# Patient Record
Sex: Female | Born: 1970
Health system: Southern US, Community
[De-identification: ages and names within clinical notes are randomized; demographics above are authoritative.]

## PROBLEM LIST (undated history)

## (undated) DIAGNOSIS — I471 Supraventricular tachycardia: Secondary | ICD-10-CM

## (undated) HISTORY — PX: ABLATION: SHX5711

---
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
## 2006-04-10 HISTORY — PX: ABDOMINAL HYSTERECTOMY: SHX81

## 2011-03-29 ENCOUNTER — Emergency Department: Payer: Self-pay | Admitting: Internal Medicine

## 2012-06-24 ENCOUNTER — Encounter (HOSPITAL_COMMUNITY): Payer: Self-pay | Admitting: *Deleted

## 2012-06-24 ENCOUNTER — Emergency Department (HOSPITAL_COMMUNITY)
Admission: EM | Admit: 2012-06-24 | Discharge: 2012-06-24 | Disposition: A | Discharge status: Home / Self Care | Payer: 59 | Source: Home / Self Care

## 2012-06-24 DIAGNOSIS — R05 Cough: Secondary | ICD-10-CM

## 2012-06-24 DIAGNOSIS — R059 Cough, unspecified: Secondary | ICD-10-CM

## 2012-06-24 DIAGNOSIS — J069 Acute upper respiratory infection, unspecified: Secondary | ICD-10-CM

## 2012-06-24 MED ORDER — HYDROCODONE-HOMATROPINE 5-1.5 MG/5ML PO SYRP: 5.0000 mL | ORAL_SOLUTION | Freq: Four times a day (QID) | ORAL | Status: DC | PRN

## 2012-06-24 NOTE — ED Provider Notes (Signed)
History     CSN: [id number]  Arrival date & time 06/24/12  1651   None     Chief Complaint  Patient presents with  . URI    (Consider location/radiation/quality/duration/timing/severity/associated sxs/prior treatment) HPI Comments: 42 year old female states she has had a head cold for 3-4 weeks. Is dominated by a persistent cough. At nighttime she has PND that also exacerbates her cough. She has a change in voice but no dyspnea. She felt hot then cold and had sweating this past weekend but no documented fever. She feels like there is a deep down chest congestion. She denies earache or sore throat.   History reviewed. No pertinent past medical history.  Past Surgical History  Procedure Laterality Date  . Appendectomy    . Abdominal hysterectomy      Family History  Problem Relation Age of Onset  . Colon cancer Mother     History  Substance Use Topics  . Smoking status: Never Smoker   . Smokeless tobacco: Not on file  . Alcohol Use: Yes     Comment: socially    OB History   Grav Para Term Preterm Abortions TAB SAB Ect Mult Living                  Review of Systems  Constitutional: Negative for fever, chills, activity change, appetite change and fatigue.  HENT: Positive for congestion, rhinorrhea and postnasal drip. Negative for sore throat, facial swelling, neck pain and neck stiffness.   Eyes: Negative.   Respiratory: Negative.   Cardiovascular: Negative.   Gastrointestinal: Negative.   Genitourinary: Negative.   Musculoskeletal: Negative.   Skin: Negative for pallor and rash.  Neurological: Negative.     Allergies  Penicillins  Home Medications   Current Outpatient Rx  Name  Route  Sig  Dispense  Refill  . estradiol (VIVELLE-DOT) 0.05 MG/24HR   Transdermal   Place 1 patch onto the skin 2 (two) times a week.         . Pseudoeph-Doxylamine-DM-APAP (NYQUIL MULTI-SYMPTOM PO)   Oral   Take by mouth.         Marland Kitchen HYDROcodone-homatropine (HYCODAN)  5-1.5 MG/5ML syrup   Oral   Take 5 mLs by mouth every 6 (six) hours as needed for cough.   120 mL   0     BP 118/59  Pulse 75  Temp(Src) 98.1 F (36.7 C) (Oral)  Resp 16  SpO2 99%  Physical Exam  Nursing note and vitals reviewed. Constitutional: She is oriented to person, place, and time. She appears well-developed and well-nourished. No distress.  HENT:  Bilateral TMs are normal Oropharynx with minor erythema and clear PND. No exudates or tonsillar enlargement.  Eyes: Conjunctivae and EOM are normal.  Neck: Normal range of motion. Neck supple.  Cardiovascular: Normal rate, regular rhythm and normal heart sounds.   Pulmonary/Chest: Effort normal and breath sounds normal. No respiratory distress. She has no wheezes. She has no rales.  Musculoskeletal: Normal range of motion. She exhibits no edema.  Lymphadenopathy:    She has no cervical adenopathy.  Neurological: She is alert and oriented to person, place, and time.  Skin: Skin is warm and dry. No rash noted.  Psychiatric: She has a normal mood and affect.    ED Course  Procedures (including critical care time)  Labs Reviewed - No data to display No results found.   1. URI (upper respiratory infection)   2. Cough       MDM  The patient did not have a source for bacterial infection. This was likely a persistent URI versus allergy. Her lungs are clear and she does not has symptoms or signs of pneumonia. She will be treated with Hycodan syrup 1 teaspoon every 4 hours when necessary cough and drainage. She is aware this may make her drowsy.  Hayden Rasmussen, NP 06/24/12 [phone]

## 2012-06-24 NOTE — ED Provider Notes (Signed)
Medical screening examination/treatment/procedure(s) were performed by non-physician practitioner and as supervising physician I was immediately available for consultation/collaboration.  Leslee Home, M.D.  Reuben Likes, MD 06/24/12 2101

## 2012-06-24 NOTE — ED Notes (Signed)
C/o head cold for 1 month with non-prod. Cough and  headache.  Had a R earache this weekend. No sorethroat.  Has had a fever off and on since Sat.  Face is flushed.

## 2012-11-20 ENCOUNTER — Emergency Department (HOSPITAL_COMMUNITY): Payer: 59

## 2012-11-20 ENCOUNTER — Encounter (HOSPITAL_COMMUNITY): Payer: Self-pay | Admitting: Emergency Medicine

## 2012-11-20 ENCOUNTER — Emergency Department (HOSPITAL_COMMUNITY)
Admission: EM | Admit: 2012-11-20 | Discharge: 2012-11-20 | Disposition: A | Discharge status: Home / Self Care | Payer: 59 | Attending: Emergency Medicine | Admitting: Emergency Medicine

## 2012-11-20 DIAGNOSIS — W03XXXA Other fall on same level due to collision with another person, initial encounter: Secondary | ICD-10-CM | POA: Insufficient documentation

## 2012-11-20 DIAGNOSIS — W219XXA Striking against or struck by unspecified sports equipment, initial encounter: Secondary | ICD-10-CM | POA: Insufficient documentation

## 2012-11-20 DIAGNOSIS — Y9239 Other specified sports and athletic area as the place of occurrence of the external cause: Secondary | ICD-10-CM | POA: Insufficient documentation

## 2012-11-20 DIAGNOSIS — Y9389 Activity, other specified: Secondary | ICD-10-CM | POA: Insufficient documentation

## 2012-11-20 DIAGNOSIS — Z8679 Personal history of other diseases of the circulatory system: Secondary | ICD-10-CM | POA: Insufficient documentation

## 2012-11-20 DIAGNOSIS — S060X0A Concussion without loss of consciousness, initial encounter: Secondary | ICD-10-CM | POA: Insufficient documentation

## 2012-11-20 DIAGNOSIS — R110 Nausea: Secondary | ICD-10-CM | POA: Insufficient documentation

## 2012-11-20 DIAGNOSIS — H538 Other visual disturbances: Secondary | ICD-10-CM | POA: Insufficient documentation

## 2012-11-20 DIAGNOSIS — Z880 Allergy status to penicillin: Secondary | ICD-10-CM | POA: Insufficient documentation

## 2012-11-20 HISTORY — DX: Supraventricular tachycardia: I47.1

## 2012-11-20 MED ORDER — DIPHENHYDRAMINE HCL 50 MG/ML IJ SOLN
25.0000 mg | Freq: Once | INTRAMUSCULAR | Status: AC
  Administered 2012-11-20: 25 mg via INTRAVENOUS
  Filled 2012-11-20: qty 1

## 2012-11-20 MED ORDER — KETOROLAC TROMETHAMINE 30 MG/ML IJ SOLN
15.0000 mg | Freq: Once | INTRAMUSCULAR | Status: AC
  Administered 2012-11-20: 15 mg via INTRAVENOUS
  Filled 2012-11-20: qty 1

## 2012-11-20 MED ORDER — METOCLOPRAMIDE HCL 5 MG/ML IJ SOLN
10.0000 mg | Freq: Once | INTRAMUSCULAR | Status: AC
  Administered 2012-11-20: 10 mg via INTRAVENOUS
  Filled 2012-11-20: qty 2
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
## 2013-09-17 ENCOUNTER — Emergency Department (HOSPITAL_COMMUNITY)
Admission: EM | Admit: 2013-09-17 | Discharge: 2013-09-17 | Disposition: A | Discharge status: Home / Self Care | Payer: 59 | Attending: Emergency Medicine | Admitting: Emergency Medicine

## 2013-09-17 ENCOUNTER — Emergency Department (HOSPITAL_COMMUNITY): Payer: 59

## 2013-09-17 ENCOUNTER — Encounter (HOSPITAL_COMMUNITY): Payer: Self-pay | Admitting: Emergency Medicine

## 2013-09-17 DIAGNOSIS — J40 Bronchitis, not specified as acute or chronic: Secondary | ICD-10-CM | POA: Insufficient documentation

## 2013-09-17 DIAGNOSIS — J9801 Acute bronchospasm: Secondary | ICD-10-CM | POA: Insufficient documentation

## 2013-09-17 DIAGNOSIS — Z880 Allergy status to penicillin: Secondary | ICD-10-CM | POA: Insufficient documentation

## 2013-09-17 DIAGNOSIS — Z792 Long term (current) use of antibiotics: Secondary | ICD-10-CM | POA: Insufficient documentation

## 2013-09-17 DIAGNOSIS — Z79899 Other long term (current) drug therapy: Secondary | ICD-10-CM | POA: Insufficient documentation

## 2013-09-17 DIAGNOSIS — IMO0002 Reserved for concepts with insufficient information to code with codable children: Secondary | ICD-10-CM | POA: Insufficient documentation

## 2013-09-17 DIAGNOSIS — Z8679 Personal history of other diseases of the circulatory system: Secondary | ICD-10-CM | POA: Insufficient documentation

## 2013-09-17 LAB — CBC
HEMATOCRIT: 41.2 % (ref 36.0–46.0)
HEMOGLOBIN: 14.2 g/dL (ref 12.0–15.0)
MCH: 30.7 pg (ref 26.0–34.0)
MCHC: 34.5 g/dL (ref 30.0–36.0)
MCV: 89.2 fL (ref 78.0–100.0)
Platelets: 300 10*3/uL (ref 150–400)
RBC: 4.62 MIL/uL (ref 3.87–5.11)
RDW: 12.2 % (ref 11.5–15.5)
WBC: 7.1 10*3/uL (ref 4.0–10.5)

## 2013-09-17 LAB — BASIC METABOLIC PANEL
BUN: 13 mg/dL (ref 6–23)
CHLORIDE: 97 meq/L (ref 96–112)
CO2: 23 mEq/L (ref 19–32)
Calcium: 9.3 mg/dL (ref 8.4–10.5)
Creatinine, Ser: 0.63 mg/dL (ref 0.50–1.10)
GFR calc non Af Amer: >90 mL/min (ref >90)
Glucose, Bld: 107 mg/dL — ABNORMAL HIGH (ref 70–99)
POTASSIUM: 4.2 meq/L (ref 3.7–5.3)
SODIUM: 138 meq/L (ref 137–147)

## 2013-09-17 MED ORDER — ALBUTEROL SULFATE (2.5 MG/3ML) 0.083% IN NEBU
5.0000 mg | INHALATION_SOLUTION | Freq: Once | RESPIRATORY_TRACT | Status: AC
  Administered 2013-09-17: 5 mg via RESPIRATORY_TRACT
  Filled 2013-09-17: qty 6

## 2013-09-17 MED ORDER — PREDNISONE 10 MG PO TABS: 60.0000 mg | ORAL_TABLET | Freq: Every day | ORAL | Status: DC

## 2013-09-17 MED ORDER — IPRATROPIUM BROMIDE 0.02 % IN SOLN
0.5000 mg | Freq: Once | RESPIRATORY_TRACT | Status: AC
  Administered 2013-09-17: 0.5 mg via RESPIRATORY_TRACT
  Filled 2013-09-17: qty 2.5

## 2013-09-17 MED ORDER — SODIUM CHLORIDE 0.9 % IV SOLN
1000.0000 mL | Freq: Once | INTRAVENOUS | Status: AC
Start: 2013-09-17 — End: 2013-09-17
  Administered 2013-09-17: 1000 mL via INTRAVENOUS

## 2013-09-17 MED ORDER — PREDNISONE 20 MG PO TABS
60.0000 mg | ORAL_TABLET | Freq: Once | ORAL | Status: AC
  Administered 2013-09-17: 60 mg via ORAL
  Filled 2013-09-17: qty 3

## 2013-09-17 MED ORDER — SODIUM CHLORIDE 0.9 % IV SOLN: 1000.0000 mL | INTRAVENOUS | Status: DC

## 2013-09-17 MED ORDER — AEROCHAMBER Z-STAT PLUS/MEDIUM MISC
1.0000 | Freq: Once | Status: AC
  Filled 2013-09-17: qty 1

## 2013-09-17 MED ORDER — AEROCHAMBER PLUS W/MASK MISC
Status: AC
  Administered 2013-09-17: 1
  Filled 2013-09-17: qty 1

## 2013-09-17 MED ORDER — ALBUTEROL SULFATE HFA 108 (90 BASE) MCG/ACT IN AERS
2.0000 | INHALATION_SPRAY | RESPIRATORY_TRACT | Status: DC
  Administered 2013-09-17 (×2): 2 via RESPIRATORY_TRACT
  Filled 2013-09-17: qty 6.7

## 2013-09-17 MED ORDER — IOHEXOL 300 MG/ML  SOLN
100.0000 mL | Freq: Once | INTRAMUSCULAR | Status: AC | PRN
  Administered 2013-09-17: 100 mL via INTRAVENOUS

## 2013-09-17 NOTE — ED Notes (Addendum)
Pt states she has been on high doses flagyl and cipro for GI bacteria since march. She has had a cough, congestion, and SOB for the past week. She is concerned because she has a cardiac history.

## 2013-09-17 NOTE — ED Provider Notes (Addendum)
CSN: [id number]     Arrival date & time 09/17/13  1217 History   First MD Initiated Contact with Patient 09/17/13 1222     Chief Complaint  Patient presents with  . Cough      The history is provided by the patient and medical records.   Patient presents the emergency department increasing cough and some shortness of breath over the past 2 weeks.  She reports her cough and shortness breath is worsened today.  Now she has exertional shortness of breath.  Denies active chest pain.  No history of DVT or pulmonary embolism.  She's currently on antibiotics and has been so for approximately 2 months for Giardia as well as Campylobacter diarrhea.  She's followed closely by her GI team for this.  She has a history of SVT status post ablation.  She's had some intermittent palpitations.  Currently she is without palpitations.  No active chest pain.  Her breathing seems somewhat short as the history is obtained.  Denies fever and chills   Past Medical History  Diagnosis Date  . SVT (supraventricular tachycardia)    Past Surgical History  Procedure Laterality Date  . Appendectomy    . Abdominal hysterectomy    . Ablation     Family History  Problem Relation Age of Onset  . Colon cancer Mother    History  Substance Use Topics  . Smoking status: Never Smoker   . Smokeless tobacco: Not on file  . Alcohol Use: Yes     Comment: socially   OB History   Grav Para Term Preterm Abortions TAB SAB Ect Mult Living                 Review of Systems  All other systems reviewed and are negative.     Allergies  Penicillins  Home Medications   Prior to Admission medications   Medication Sig Start Date End Date Taking? Authorizing Provider  estradiol (VIVELLE-DOT) 0.05 MG/24HR Place 1 patch onto the skin 2 (two) times a week.   Yes Historical Provider, MD  Omega-3 Fatty Acids (FISH OIL) 1200 MG CAPS Take 1,200 mg by mouth 2 (two) times daily.   Yes Historical Provider, MD  rifaximin  (XIFAXAN) 550 MG TABS tablet Take 550 mg by mouth 2 (two) times daily. Started on 09-08-13 on day 10 of therapy. Total therapy of 30 days   Yes Historical Provider, MD  predniSONE (DELTASONE) 10 MG tablet Take 6 tablets (60 mg total) by mouth daily. 09/17/13   Hoy Morn, MD   BP 127/78  Pulse 88  Temp(Src) 98.5 F (36.9 C) (Oral)  Resp 23  Ht 5\' 9"  (1.753 m)  SpO2 100% Physical Exam  Nursing note and vitals reviewed. Constitutional: She is oriented to person, place, and time. She appears well-developed and well-nourished. No distress.  HENT:  Head: Normocephalic and atraumatic.  Eyes: EOM are normal.  Neck: Normal range of motion.  Cardiovascular: Normal rate, regular rhythm and normal heart sounds.   Pulmonary/Chest: Effort normal. She has wheezes.  Left-sided rub noted  Abdominal: Soft. She exhibits no distension. There is no tenderness.  Musculoskeletal: Normal range of motion.  Neurological: She is alert and oriented to person, place, and time.  Skin: Skin is warm and dry.  Psychiatric: She has a normal mood and affect. Judgment normal.    ED Course  Procedures (including critical care time) Labs Review Labs Reviewed  BASIC METABOLIC PANEL - Abnormal; Notable for the following:  Glucose, Bld 107 (*)    All other components within normal limits  CBC    Imaging Review Dg Chest 2 View  09/17/2013   CLINICAL DATA:  Cough and difficulty breathing  EXAM: CHEST  2 VIEW  COMPARISON:  None.  FINDINGS: Lungs are clear. Heart size and pulmonary vascularity are normal. No adenopathy. No bone lesions.  IMPRESSION: No edema or consolidation.   Electronically Signed   By: Lowella Grip M.D.   On: 09/17/2013 13:30   Ct Angio Chest W/cm &/or Wo Cm  09/17/2013   CLINICAL DATA:  Cough and shortness of breath.  EXAM: CT ANGIOGRAPHY CHEST WITH CONTRAST  TECHNIQUE: Multidetector CT imaging of the chest was performed using the standard protocol during bolus administration of  intravenous contrast. Multiplanar CT image reconstructions and MIPs were obtained to evaluate the vascular anatomy.  CONTRAST:  111mL OMNIPAQUE IOHEXOL 300 MG/ML  SOLN  COMPARISON:  Chest radiograph 09/17/2013  FINDINGS: The pulmonary arteries are not well opacified on this study. The main pulmonary artery only measures 155 Hounsfield units. As result, limited evaluation of the small and distal pulmonary arteries. There is no evidence for a large or central pulmonary embolism. There appears to be streak artifact in left lower lobe pulmonary arteries on sequence 5, image 113. No significant pericardial or pleural fluid. No evidence for chest lymphadenopathy. Images of the upper abdomen are unremarkable.  The trachea and mainstem bronchi are patent. There are subtle patchy densities in both lungs which could represent atelectasis or mild edema. No extensive airspace disease or consolidation.  No acute bone abnormality. 36mm pulmonary nodule in the left lower lobe on sequence 6, image 52.  Review of the MIP images confirms the above findings.  IMPRESSION: No evidence for a large or central pulmonary embolism as described.  5 mm nodule in the left lower lobe is indeterminate. If the patient is at high risk for bronchogenic carcinoma, follow-up chest CT at 6-12 months is recommended. If the patient is at low risk for bronchogenic carcinoma, follow-up chest CT at 12 months is recommended. This recommendation follows the consensus statement: Guidelines for Management of Small Pulmonary Nodules Detected on CT Scans: A Statement from the [place] as published in Radiology 2005;237:395-400.  Subtle patchy densities in both lungs could represent atelectasis or mild edema.   Electronically Signed   By: Markus Daft M.D.   On: 09/17/2013 15:12  I personally reviewed the imaging tests through PACS system I reviewed available ER/hospitalization records through the EMR    EKG Interpretation   Date/Time:  Wednesday  September 17 2013 12:31:14 EDT Ventricular Rate:  81 PR Interval:  183 QRS Duration: 78 QT Interval:  365 QTC Calculation: 424 R Axis:   79 Text Interpretation:  Sinus rhythm Borderline T abnormalities, diffuse  leads No old tracing to compare Confirmed by Morgaine Kimball  MD, Lennette Bihari ([id number]) on  09/17/2013 12:50:39 PM      MDM   Final diagnoses:  Bronchitis  Bronchospasm    Initial breathing treatment did not cause significant improvement in her symptoms.  Her chest x-ray is clear.  Given ongoing shortness of breath a CT angiogram was obtained to evaluate her lung parenchyma as well as evaluate for pulmonary embolus.  3:44 PM Patient feels somewhat better at this time.  Her breathing seems significantly improved from my initial evaluation of the patient.  I suspect this is bronchitis with bronchospasm.  No obvious pneumonia noted on chest x-ray or CT.  Although the  bolus timing of CT angiogram to evaluate for PE was not optimal there is no evidence of large central PE.  She is not hypoxic.  I will not pursue this further at this time.  She was given another albuterol MDI treatments and she took this herself in the room.  I will place her on prednisone for the next several days.  She will continue albuterol every 4 hours x2 days then every 4 hours when necessary cough and shortness of breath.  I've encouraged patient to return to the emergency apartment for any new or worsening symptoms.  All questions answered.    Hoy Morn, MD 09/17/13 1544  I discussed with the patient the need for a repeat CT chest in one year to evaluate incidental pulmonary nodule noted today.  Patient does not smoke cigarettes and has no prior history of tobacco abuse.  12 months should be sufficient.  Hoy Morn, MD 09/17/13 [phone]

## 2013-09-17 NOTE — Discharge Instructions (Signed)
Bronchospasm, Adult A bronchospasm is a spasm or tightening of the airways going into the lungs. During a bronchospasm breathing becomes more difficult because the airways get smaller. When this happens there can be coughing, a whistling sound when breathing (wheezing), and difficulty breathing. Bronchospasm is often associated with asthma, but not all patients who experience a bronchospasm have asthma. CAUSES  A bronchospasm is caused by inflammation or irritation of the airways. The inflammation or irritation may be triggered by:   Allergies (such as to animals, pollen, food, or mold). Allergens that cause bronchospasm may cause wheezing immediately after exposure or many hours later.   Infection. Viral infections are believed to be the most common cause of bronchospasm.   Exercise.   Irritants (such as pollution, cigarette smoke, strong odors, aerosol sprays, and paint fumes).   Weather changes. Winds increase molds and pollens in the air. Rain refreshes the air by washing irritants out. Cold air may cause inflammation.   Stress and emotional upset.  SIGNS AND SYMPTOMS   Wheezing.   Excessive nighttime coughing.   Frequent or severe coughing with a simple cold.   Chest tightness.   Shortness of breath.  DIAGNOSIS  Bronchospasm is usually diagnosed through a history and physical exam. Tests, such as chest X-rays, are sometimes done to look for other conditions. TREATMENT   Inhaled medicines can be given to open up your airways and help you breathe. The medicines can be given using either an inhaler or a nebulizer machine.  Corticosteroid medicines may be given for severe bronchospasm, usually when it is associated with asthma. HOME CARE INSTRUCTIONS   Always have a plan prepared for seeking medical care. Know when to call your health care provider and local emergency services (911 in the [place] Know where you can access local emergency care.  Only take medicines as  directed by your health care provider.  If you were prescribed an inhaler or nebulizer machine, ask your health care provider to explain how to use it correctly. Always use a spacer with your inhaler if you were given one.  It is necessary to remain calm during an attack. Try to relax and breathe more slowly.  Control your home environment in the following ways:   Change your heating and air conditioning filter at least once a month.   Limit your use of fireplaces and wood stoves.  Do not smoke and do not allow smoking in your home.   Avoid exposure to perfumes and fragrances.   Get rid of pests (such as roaches and mice) and their droppings.   Throw away plants if you see mold on them.   Keep your house clean and dust free.   Replace carpet with wood, tile, or vinyl flooring. Carpet can trap dander and dust.   Use allergy-proof pillows, mattress covers, and box spring covers.   Wash bed sheets and blankets every week in hot water and dry them in a dryer.   Use blankets that are made of polyester or cotton.   Wash hands frequently. SEEK MEDICAL CARE IF:   You have muscle aches.   You have chest pain.   The sputum changes from clear or white to yellow, green, gray, or bloody.   The sputum you cough up gets thicker.   There are problems that may be related to the medicine you are given, such as a rash, itching, swelling, or trouble breathing.  SEEK IMMEDIATE MEDICAL CARE IF:   You have worsening wheezing and coughing  even after taking your prescribed medicines.   You have increased difficulty breathing.   You develop severe chest pain. MAKE SURE YOU:   Understand these instructions.  Will watch your condition.  Will get help right away if you are not doing well or get worse. Document Released: 03/30/2003 Document Revised: 11/27/2012 Document Reviewed: 09/16/2012 [facility] Patient Information 2014 [place].

## 2013-09-17 NOTE — ED Notes (Signed)
Received report on pt from [place]. Pt monitored by pulse ox, 12 lead, and blood pressure.

## 2014-10-13 ENCOUNTER — Ambulatory Visit (INDEPENDENT_AMBULATORY_CARE_PROVIDER_SITE_OTHER): Payer: 59 | Admitting: Internal Medicine

## 2014-10-13 VITALS — BP 126/84 | HR 78 | Ht 69.0 in | Wt 212.0 lb

## 2014-10-13 DIAGNOSIS — R002 Palpitations: Secondary | ICD-10-CM | POA: Insufficient documentation

## 2014-10-13 DIAGNOSIS — I471 Supraventricular tachycardia: Secondary | ICD-10-CM

## 2014-10-13 NOTE — Patient Instructions (Signed)
Medication Instructions: - no changes  Labwork: - none  Procedures/Testing: - none  Follow-Up: - Dr. Lovena Le will see you back as needed.  Any Additional Special Instructions Will Be Listed Below (If Applicable). - none

## 2014-10-13 NOTE — Assessment & Plan Note (Signed)
The etiology of her symptoms is unclear. She could have atrial tachycardia and I have asked the patient to reduce caffeine and ETOH intake, and to try and get plenty of sleep. If her symptoms return, then we will plan to obtain a cardiac monitor to better correlate her symptoms with any arrhythmia. We discussed medical therapy. If she has any significant arrhythmias, might consider flecainide.

## 2014-10-13 NOTE — Progress Notes (Signed)
      HPI Alison Chen is referred today by Dr. Philis Pique for evaluation and management of palpitations. She has a long h/o SVT and underwent catheter ablation in 1991 by Dr. Allen Derry in [place]. She had recurrent palpitations and underwent EP study in 1996 but no ablatio was carried out. She has done well until a few months ago when she experienced palpitations. She has had 2 brief episodes of palpitations which she thinks feels different than her SVT. These episodes are very brief and short lived lasting less than a minute. No syncope. No chest pain or sob. She admits to using ETOH but denies caffeine intake. She has gained 30 lbs since moving to [place] from [place], [place].  Allergies  Allergen Reactions  . Penicillins Rash     Current Outpatient Prescriptions  Medication Sig Dispense Refill  . estradiol (VIVELLE-DOT) 0.05 MG/24HR Place 1 patch onto the skin 2 (two) times a week.    . Omega-3 Fatty Acids (FISH OIL) 1200 MG CAPS Take 1,200 mg by mouth 2 (two) times daily.     No current facility-administered medications for this visit.     Past Medical History  Diagnosis Date  . SVT (supraventricular tachycardia)     ROS:   All systems reviewed and negative except as noted in the HPI.   Past Surgical History  Procedure Laterality Date  . Appendectomy  1985  . Abdominal hysterectomy  2008  . Ablation  1991 and 1996    cardiac (X) 2.      Family History  Problem Relation Age of Onset  . Colon cancer Mother 95     History   Social History  . Marital Status: Married    Spouse Name: N/A  . Number of Children: N/A  . Years of Education: N/A   Occupational History  . Not on file.   Social History Main Topics  . Smoking status: Never Smoker   . Smokeless tobacco: Not on file  . Alcohol Use: Yes     Comment: socially  . Drug Use: No  . Sexual Activity: Yes    Birth Control/ Protection: None   Other Topics Concern  . Not on file   Social History  Narrative     BP 126/84 mmHg  Pulse 78  Ht 5\' 9"  (1.753 m)  Wt 212 lb (96.163 kg)  BMI 31.29 kg/m2  Physical Exam:  Well appearing 44 yo woman, NAD HEENT: Unremarkable Neck:  6 cm JVD, no thyromegally Back:  No CVA tenderness Lungs:  Clear with no wheezes HEART:  Regular rate rhythm, no murmurs, no rubs, no clicks Abd:  soft, positive bowel sounds, no organomegally, no rebound, no guarding Ext:  2 plus pulses, no edema, no cyanosis, no clubbing Skin:  No rashes no nodules Neuro:  CN II through XII intact, motor grossly intact  EKG - NSR with no pre-excitation  Assess/Plan:

## 2014-10-13 NOTE — Assessment & Plan Note (Signed)
She is s/p catheter ablation with no recurrent documented sustained SVT. She will continue watchful waiting.

## 2015-02-08 ENCOUNTER — Ambulatory Visit (HOSPITAL_BASED_OUTPATIENT_CLINIC_OR_DEPARTMENT_OTHER): Payer: 59 | Attending: Otolaryngology | Admitting: Radiology

## 2015-02-08 VITALS — Ht 67.0 in | Wt 200.0 lb

## 2015-02-08 DIAGNOSIS — G4710 Hypersomnia, unspecified: Secondary | ICD-10-CM | POA: Insufficient documentation

## 2015-02-08 DIAGNOSIS — R0683 Snoring: Secondary | ICD-10-CM | POA: Insufficient documentation

## 2015-02-08 DIAGNOSIS — R400 Somnolence: Secondary | ICD-10-CM

## 2015-02-13 DIAGNOSIS — G4710 Hypersomnia, unspecified: Secondary | ICD-10-CM | POA: Diagnosis not present

## 2015-02-13 NOTE — Progress Notes (Signed)
  Patient Name: Alison Chen, Comins Date: 02/08/2015 Gender: Female D.O.B: 1970-06-19 Age (years): 43 Referring Provider: Izora Gala Height (inches): 48 Interpreting Physician: Baird Lyons MD, ABSM Weight (lbs): 200 RPSGT: Zadie Rhine BMI: 31 MRN: [id number] Neck Size: 16.00 CLINICAL INFORMATION Sleep Study Type: NPSG Indication for sleep study: Snoring Epworth Sleepiness Score: 8  SLEEP STUDY TECHNIQUE As per the AASM Manual for the Scoring of Sleep and Associated Events v2.3 (April 2016) with a hypopnea requiring 4% desaturations. The channels recorded and monitored were frontal, central and occipital EEG, electrooculogram (EOG), submentalis EMG (chin), nasal and oral airflow, thoracic and abdominal wall motion, anterior tibialis EMG, snore microphone, electrocardiogram, and pulse oximetry.  MEDICATIONS Patient's medications include: charted for review. Medications self-administered by patient during sleep study :Tylenol PM  SLEEP ARCHITECTURE The study was initiated at 10:24:05 PM and ended at 4:54:04 AM. Sleep onset time was 43.7 minutes and the sleep efficiency was 86.9%. The total sleep time was 339.0 minutes. Stage REM latency was 91.0 minutes. The patient spent 1.18% of the night in stage N1 sleep, 53.54% in stage N2 sleep, 19.47% in stage N3 and 25.81% in REM. Alpha intrusion was absent. Supine sleep was 52.41%.  RESPIRATORY PARAMETERS The overall apnea/hypopnea index (AHI) was 0.9 per hour. There were 1 total apneas, including 0 obstructive, 1 central and 0 mixed apneas. There were 4 hypopneas and 3 RERAs. The AHI during Stage REM sleep was 3.4 per hour. AHI while supine was 1.4 per hour. The mean oxygen saturation was 93.62%. The minimum SpO2 during sleep was 90.00%. Loud snoring was noted during this study.  CARDIAC DATA The 2 lead EKG demonstrated sinus rhythm. The mean heart rate was 63.95 beats per minute. Other EKG findings include: None.  LEG  MOVEMENT DATA The total PLMS were 0 with a resulting PLMS index of 0.00. Associated arousal with leg movement index was 0.0 . IMPRESSIONS - No significant obstructive sleep apnea occurred during this study (AHI = 0.9/h). - No significant central sleep apnea occurred during this study (CAI = 0.2/h). - The patient had minimal or no oxygen desaturation during the study (Min O2 = 90.00%) - The patient snored with Loud snoring volume. - No cardiac abnormalities were noted during this study. - Clinically significant periodic limb movements did not occur during sleep. No significant associated arousals.  DIAGNOSIS - Primary Snoring (786.09 [R06.83 ICD-10]) - Normal study  RECOMMENDATIONS - Avoid alcohol, sedatives and other CNS depressants that may worsen sleep apnea and disrupt normal sleep architecture. - Sleep hygiene should be reviewed to assess factors that may improve sleep quality. - Weight management and regular exercise should be initiated or continued if appropriate.  Deneise Lever Diplomate, American Board of Sleep Medicine  ELECTRONICALLY SIGNED ON:  02/13/2015, 11:35 AM [place] PH: [phone]   FX: [phone] [place]

## 2015-04-13 DIAGNOSIS — R194 Change in bowel habit: Secondary | ICD-10-CM | POA: Diagnosis not present

## 2015-04-13 DIAGNOSIS — R1310 Dysphagia, unspecified: Secondary | ICD-10-CM | POA: Diagnosis not present

## 2015-04-13 DIAGNOSIS — Z800 Family history of malignant neoplasm of digestive organs: Secondary | ICD-10-CM | POA: Diagnosis not present

## 2015-04-13 DIAGNOSIS — K219 Gastro-esophageal reflux disease without esophagitis: Secondary | ICD-10-CM | POA: Diagnosis not present

## 2015-04-19 ENCOUNTER — Ambulatory Visit (HOSPITAL_BASED_OUTPATIENT_CLINIC_OR_DEPARTMENT_OTHER): Payer: 59

## 2015-04-28 MED FILL — ESTRADIOL PATCH 0.0375: 0.0375 | 84 days supply | Qty: 24 | Fill #1

## 2015-05-10 DIAGNOSIS — M79671 Pain in right foot: Secondary | ICD-10-CM | POA: Diagnosis not present

## 2015-06-01 DIAGNOSIS — D1801 Hemangioma of skin and subcutaneous tissue: Secondary | ICD-10-CM | POA: Diagnosis not present

## 2015-06-01 DIAGNOSIS — L821 Other seborrheic keratosis: Secondary | ICD-10-CM | POA: Diagnosis not present

## 2015-07-28 MED FILL — ESTRADIOL PATCH 0.0375: 0.0375 | 28 days supply | Qty: 8 | Fill #2 | Status: TO

## 2015-09-30 IMAGING — CT CT ANGIO CHEST
2 of 9 series · 18 of 46 positions shown · IV contrast (OMNI)
Comparison: Chest radiograph 09/17/2013

CLINICAL DATA: Cough and shortness of breath.

EXAM:
CT ANGIOGRAPHY CHEST WITH CONTRAST
TECHNIQUE: Multidetector CT imaging of the chest was performed using the
standard protocol during bolus administration of intravenous
contrast. Multiplanar CT image reconstructions and MIPs were
obtained to evaluate the vascular anatomy.
CONTRAST:  100mL OMNIPAQUE IOHEXOL 300 MG/ML  SOLN

[Series 5: thins · axial · 0.66mm/px · z∈[+1201,+1419]mm · 15 of 246 slices shown]
[im 14/246  lung]
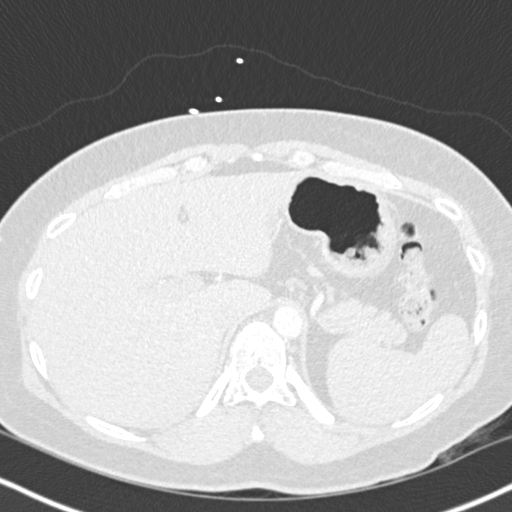
[im 28/246  soft-tissue]
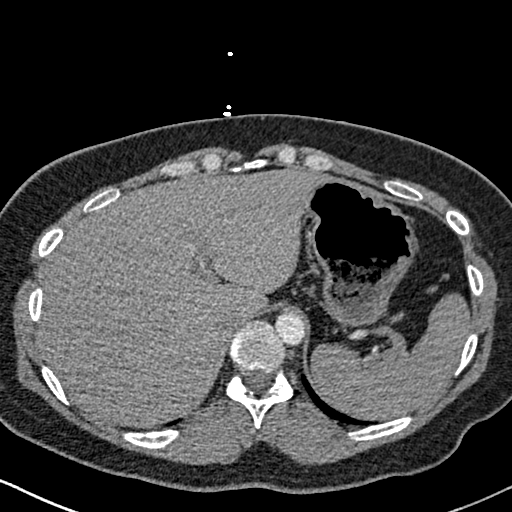
[im 41/246  lung]
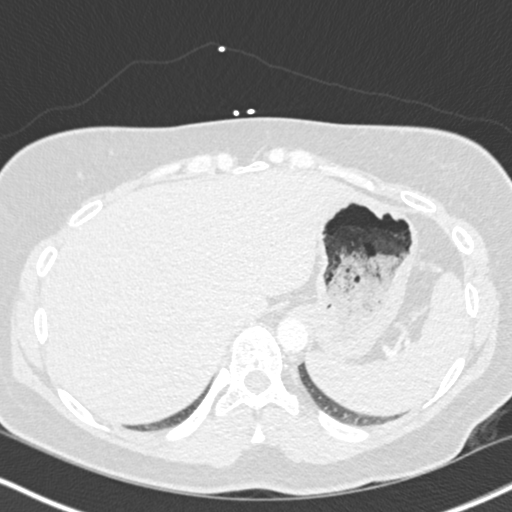
[im 55/246  soft-tissue]
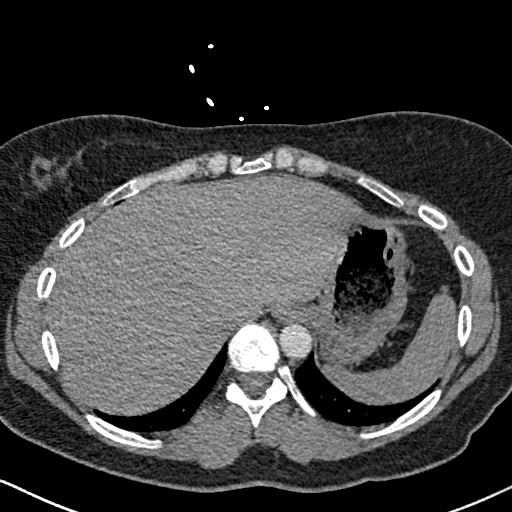
[im 82/246  lung]
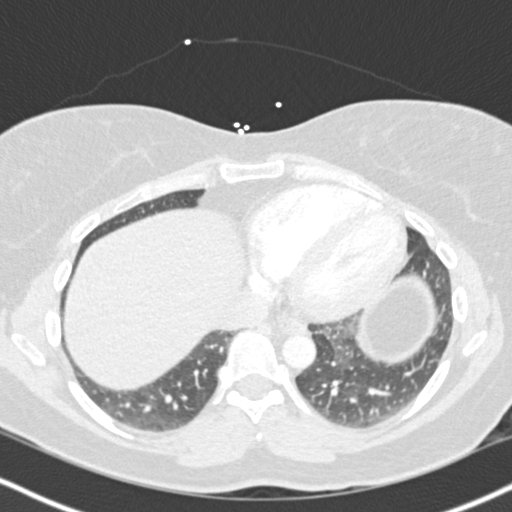
[im 96/246  soft-tissue]
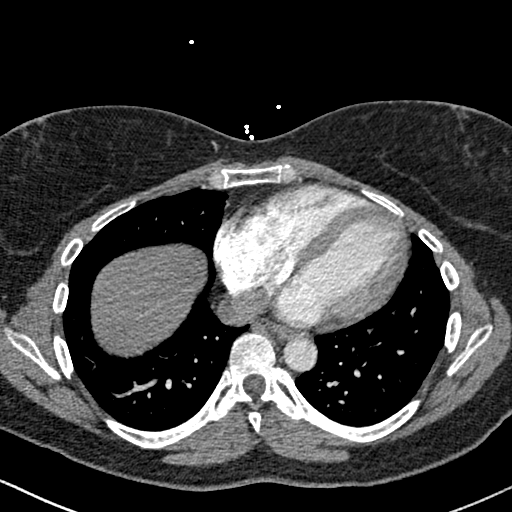
[im 109/246  lung]
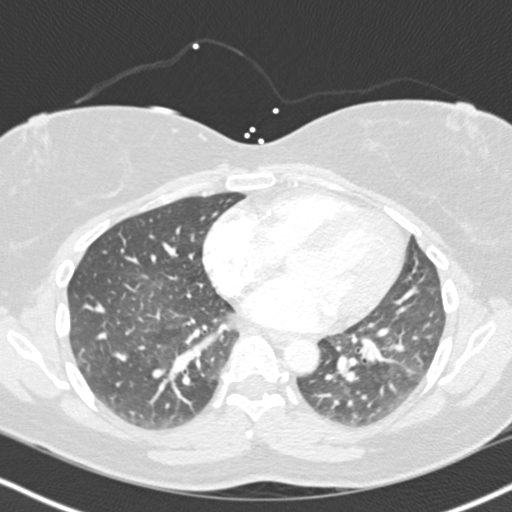
[im 123/246  soft-tissue]
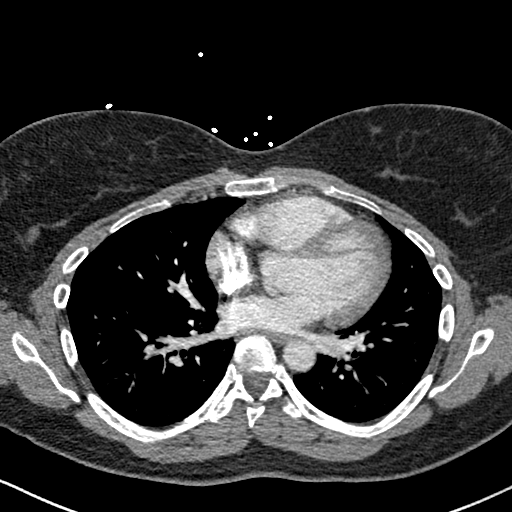
[im 137/246  lung]
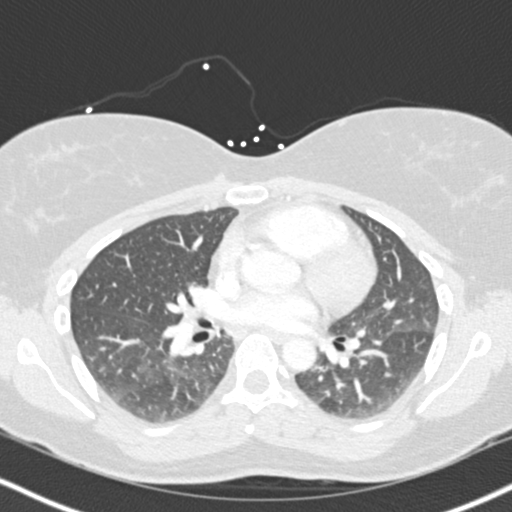
[im 150/246  soft-tissue]
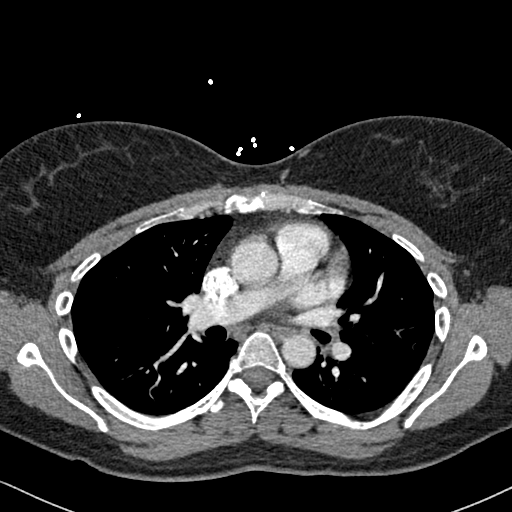
[im 164/246  lung]
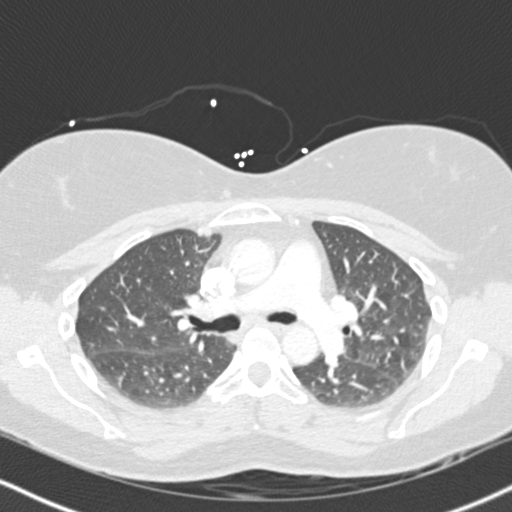
[im 191/246  soft-tissue]
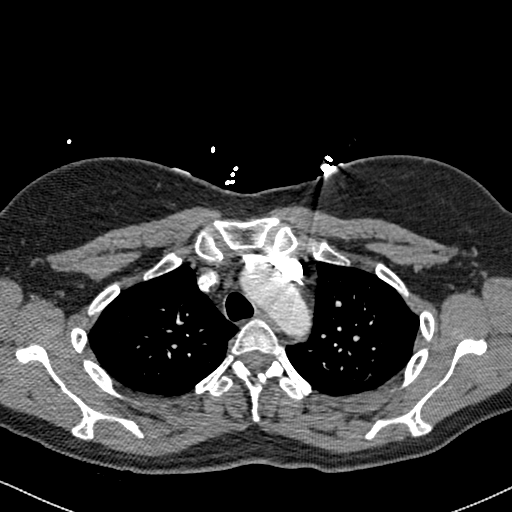
[im 205/246  lung]
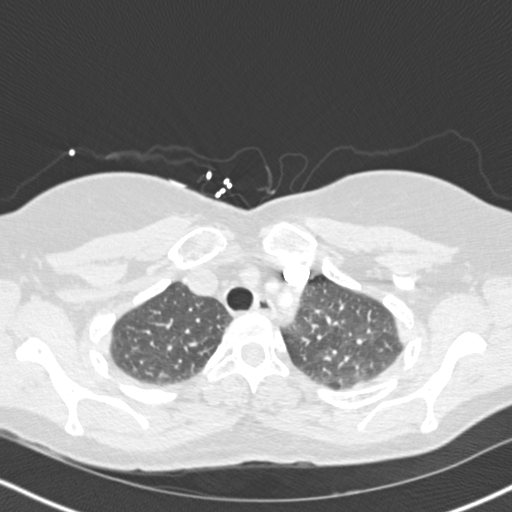
[im 218/246  soft-tissue]
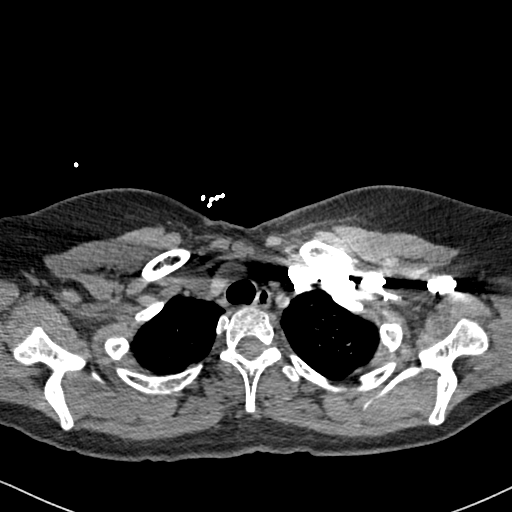
[im 232/246  lung]
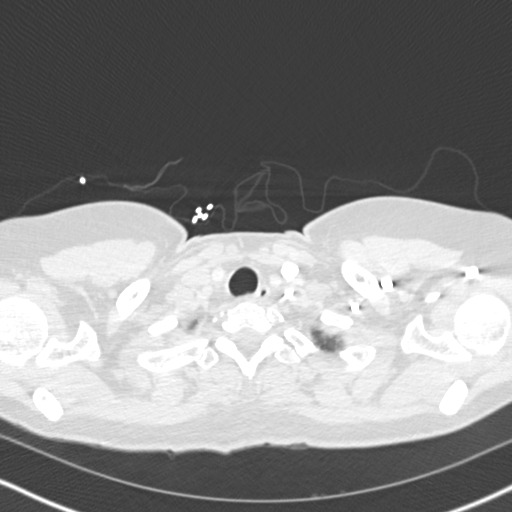

[Series 7: coronal mpr · coronal · 0.59mm/px · 3 of 108 slices shown]
[im 27/108  soft-tissue]
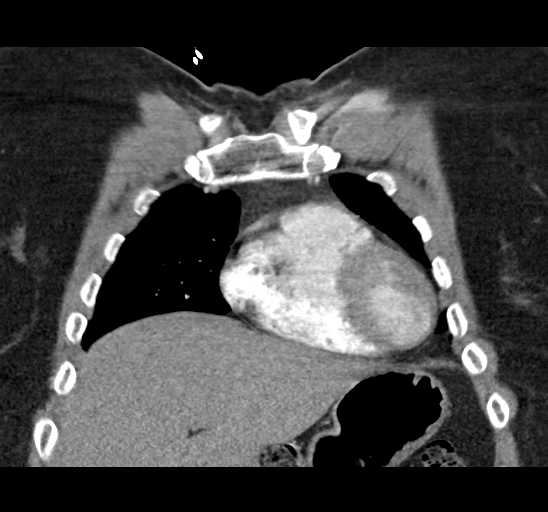
[im 54/108  soft-tissue]
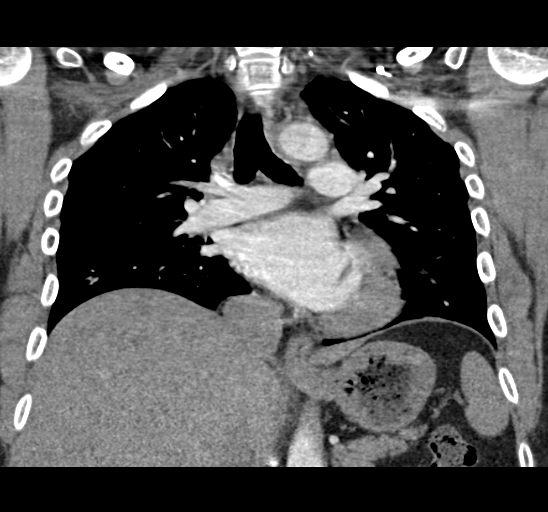
[im 81/108  soft-tissue]
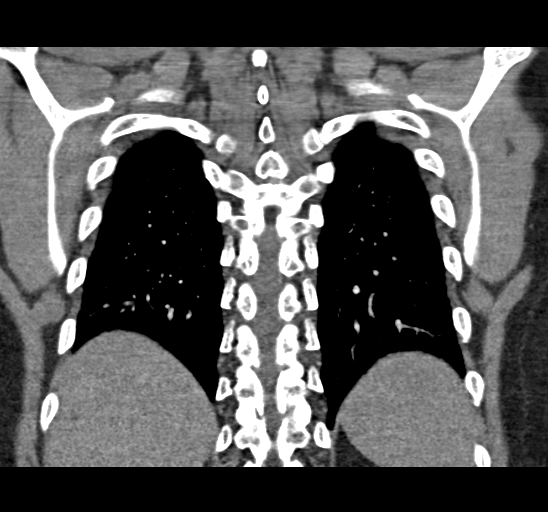

[18 of 46 positions shown; findings below may reference images not displayed]

FINDINGS: The pulmonary arteries are not well opacified on this study. The
main pulmonary artery only measures 155 Hounsfield units. As result,
limited evaluation of the small and distal pulmonary arteries. There
is no evidence for a large or central pulmonary embolism. There
appears to be streak artifact in left lower lobe pulmonary arteries
on sequence 5, image 113. No significant pericardial or pleural
fluid. No evidence for chest lymphadenopathy. Images of the upper
abdomen are unremarkable.

The trachea and mainstem bronchi are patent. There are subtle patchy
densities in both lungs which could represent atelectasis or mild
edema. No extensive airspace disease or consolidation.

No acute bone abnormality. 5mm pulmonary nodule in the left lower
lobe on sequence 6, image 52.

Review of the MIP images confirms the above findings.
IMPRESSION: No evidence for a large or central pulmonary embolism as described.

5 mm nodule in the left lower lobe is indeterminate. If the patient
is at high risk for bronchogenic carcinoma, follow-up chest CT at
6-12 months is recommended. If the patient is at low risk for
bronchogenic carcinoma, follow-up chest CT at 12 months is
recommended. This recommendation follows the consensus statement:
Guidelines for Management of Small Pulmonary Nodules Detected on CT
Scans: A Statement from the [HOSPITAL] as published in

Subtle patchy densities in both lungs could represent atelectasis or
mild edema.
